# Patient Record
Sex: Male | Born: 2005 | Race: Black or African American | Hispanic: No | Marital: Single
Health system: Southern US, Community
[De-identification: ages and names within clinical notes are randomized; demographics above are authoritative.]

---
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
## 2023-05-16 NOTE — Progress Notes (Unsigned)
   New Patient Office Visit   Subjective   Patient ID: Jason Chapman, male    DOB: 11-02-2005  Age: 18 y.o. MRN: [id number]  CC: No chief complaint on file.   HPI Jason Chapman [age] year old male, presents to establish care. He  has no past medical history on file.  HPI    No outpatient encounter medications on file as of 05/17/2023.   No facility-administered encounter medications on file as of 05/17/2023.    No past surgical history on file.  ROS    Objective    There were no vitals taken for this visit.  Physical Exam    Assessment & Plan:  There are no diagnoses linked to this encounter.  No follow-ups on file.   Cruzita Lederer Newman Nip, FNP

## 2023-05-17 ENCOUNTER — Encounter: Payer: Medicaid Other | Admitting: Family Medicine

## 2023-05-17 DIAGNOSIS — E038 Other specified hypothyroidism: Secondary | ICD-10-CM

## 2023-05-17 DIAGNOSIS — E559 Vitamin D deficiency, unspecified: Secondary | ICD-10-CM

## 2023-05-17 DIAGNOSIS — R7301 Impaired fasting glucose: Secondary | ICD-10-CM

## 2023-05-17 DIAGNOSIS — Z1159 Encounter for screening for other viral diseases: Secondary | ICD-10-CM

## 2023-05-17 DIAGNOSIS — Z114 Encounter for screening for human immunodeficiency virus [HIV]: Secondary | ICD-10-CM

## 2023-05-17 DIAGNOSIS — Z136 Encounter for screening for cardiovascular disorders: Secondary | ICD-10-CM

## 2023-05-17 NOTE — Progress Notes (Signed)
No show

## 2023-05-23 ENCOUNTER — Encounter: Payer: Self-pay | Admitting: Family Medicine

## 2023-12-08 ENCOUNTER — Inpatient Hospital Stay
Admission: RE | Admit: 2023-12-08 | Discharge: 2023-12-08 | Disposition: A | Discharge status: Home / Self Care | Payer: Self-pay | Source: Ambulatory Visit
